# Patient Record
Sex: Male | Born: 1970 | Race: White | Hispanic: No | Marital: Married | Smoking: Never smoker
Health system: Southern US, Community
[De-identification: ages and names within clinical notes are randomized; demographics above are authoritative.]

## PROBLEM LIST (undated history)

## (undated) HISTORY — PX: TONSILLECTOMY: SUR1361

---

## 2005-12-31 ENCOUNTER — Emergency Department: Payer: Self-pay | Admitting: Emergency Medicine

## 2009-08-27 ENCOUNTER — Ambulatory Visit: Payer: Self-pay | Admitting: Family Medicine

## 2009-08-28 ENCOUNTER — Ambulatory Visit: Payer: Self-pay | Admitting: Oncology

## 2009-09-04 ENCOUNTER — Ambulatory Visit: Payer: Self-pay | Admitting: Oncology

## 2009-09-15 ENCOUNTER — Ambulatory Visit: Payer: Self-pay | Admitting: General Surgery

## 2009-09-16 ENCOUNTER — Ambulatory Visit: Payer: Self-pay | Admitting: General Surgery

## 2009-09-22 ENCOUNTER — Ambulatory Visit: Payer: Self-pay | Admitting: Oncology

## 2009-09-25 ENCOUNTER — Ambulatory Visit: Payer: Self-pay | Admitting: Oncology

## 2009-10-01 ENCOUNTER — Ambulatory Visit: Payer: Self-pay | Admitting: Vascular Surgery

## 2009-10-20 ENCOUNTER — Ambulatory Visit: Payer: Self-pay | Admitting: Oncology

## 2009-11-20 ENCOUNTER — Ambulatory Visit: Payer: Self-pay | Admitting: Oncology

## 2009-12-20 ENCOUNTER — Ambulatory Visit: Payer: Self-pay | Admitting: Oncology

## 2010-01-20 ENCOUNTER — Ambulatory Visit: Payer: Self-pay | Admitting: Oncology

## 2010-02-19 ENCOUNTER — Ambulatory Visit: Payer: Self-pay | Admitting: Oncology

## 2010-03-22 ENCOUNTER — Ambulatory Visit: Payer: Self-pay | Admitting: Internal Medicine

## 2010-03-22 ENCOUNTER — Ambulatory Visit: Payer: Self-pay | Admitting: Oncology

## 2010-04-22 ENCOUNTER — Ambulatory Visit: Payer: Self-pay | Admitting: Internal Medicine

## 2010-04-22 ENCOUNTER — Ambulatory Visit: Payer: Self-pay | Admitting: Oncology

## 2010-04-27 ENCOUNTER — Ambulatory Visit: Payer: Self-pay | Admitting: Oncology

## 2010-05-06 ENCOUNTER — Ambulatory Visit: Payer: Self-pay | Admitting: Vascular Surgery

## 2010-05-22 ENCOUNTER — Ambulatory Visit: Payer: Self-pay | Admitting: Internal Medicine

## 2010-05-22 ENCOUNTER — Ambulatory Visit: Payer: Self-pay | Admitting: Oncology

## 2010-06-22 ENCOUNTER — Ambulatory Visit: Payer: Self-pay | Admitting: Internal Medicine

## 2010-06-22 ENCOUNTER — Ambulatory Visit: Payer: Self-pay | Admitting: Oncology

## 2010-07-22 ENCOUNTER — Ambulatory Visit: Payer: Self-pay | Admitting: Oncology

## 2010-07-27 ENCOUNTER — Ambulatory Visit: Payer: Self-pay | Admitting: Oncology

## 2010-08-22 ENCOUNTER — Ambulatory Visit: Payer: Self-pay

## 2010-08-22 ENCOUNTER — Ambulatory Visit: Payer: Self-pay | Admitting: Oncology

## 2010-11-12 ENCOUNTER — Ambulatory Visit: Payer: Self-pay | Admitting: Radiation Oncology

## 2010-11-18 ENCOUNTER — Ambulatory Visit: Payer: Self-pay | Admitting: Oncology

## 2010-11-21 ENCOUNTER — Ambulatory Visit: Payer: Self-pay | Admitting: Radiation Oncology

## 2010-12-15 ENCOUNTER — Ambulatory Visit: Payer: Self-pay | Admitting: Oncology

## 2010-12-21 ENCOUNTER — Ambulatory Visit: Payer: Self-pay | Admitting: Oncology

## 2011-02-20 IMAGING — NM NM  CARDIAC MUGA REST SCAN 2 0F 2
4 series · 24 of 24 positions shown · non-contrast
Comparison: none

REASON FOR EXAM: hodgkins lymphoma high risk meds  prechemo
COMMENTS:

[Series 1000: lao 45-gated (results) · 6.59mm/px · 6 of 24 frames shown]
[frame 3/24]
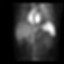
[frame 7/24]
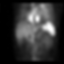
[frame 11/24]
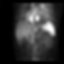
[frame 15/24]
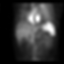
[frame 19/24]
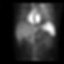
[frame 23/24]
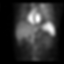

[Series 1000: lao 45-gated · 6.59mm/px · 6 of 24 frames shown]
[frame 3/24]
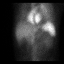
[frame 7/24]
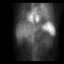
[frame 11/24]
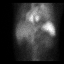
[frame 15/24]
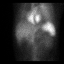
[frame 19/24]
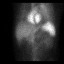
[frame 23/24]
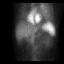

[Series 1000: ant-gated · 6.59mm/px · 6 of 24 frames shown]
[frame 3/24]
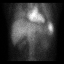
[frame 7/24]
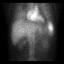
[frame 11/24]
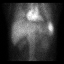
[frame 15/24]
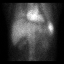
[frame 19/24]
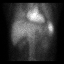
[frame 23/24]
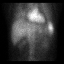

[Series 1000: lao 70-gated · 6.59mm/px · 6 of 24 frames shown]
[frame 3/24]
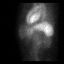
[frame 7/24]
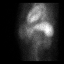
[frame 11/24]
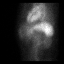
[frame 15/24]
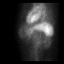
[frame 19/24]
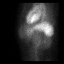
[frame 23/24]
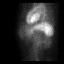

[24 of 24 positions shown; findings below may reference images not displayed]

PROCEDURE:     NM  - NM REST MUGA SCAN [DATE] OF [DATE]  - [DATE]  [DATE] [DATE]  [DATE]

RESULT:     Following intravenous administration of 3 mL PYP and 23.8 mCi
technetium 99m pertechnetate, rest MUGA scan was performed in 8 views.
Review of the left ventricular cine loop shows no akinetic or dyskinetic
myocardial segments.

The left ventricular ejection fraction measures 56.5% which is in the normal
range.
IMPRESSION: Normal study. The left ventricular ejection fraction measures 56.5%.

## 2011-02-21 IMAGING — XA IR VASCULAR PROCEDURE
1 series · 1 of 1 positions shown · non-contrast
Comparison: none

[Series 1: single · 1 of 1 slices shown]
[im 1/1]
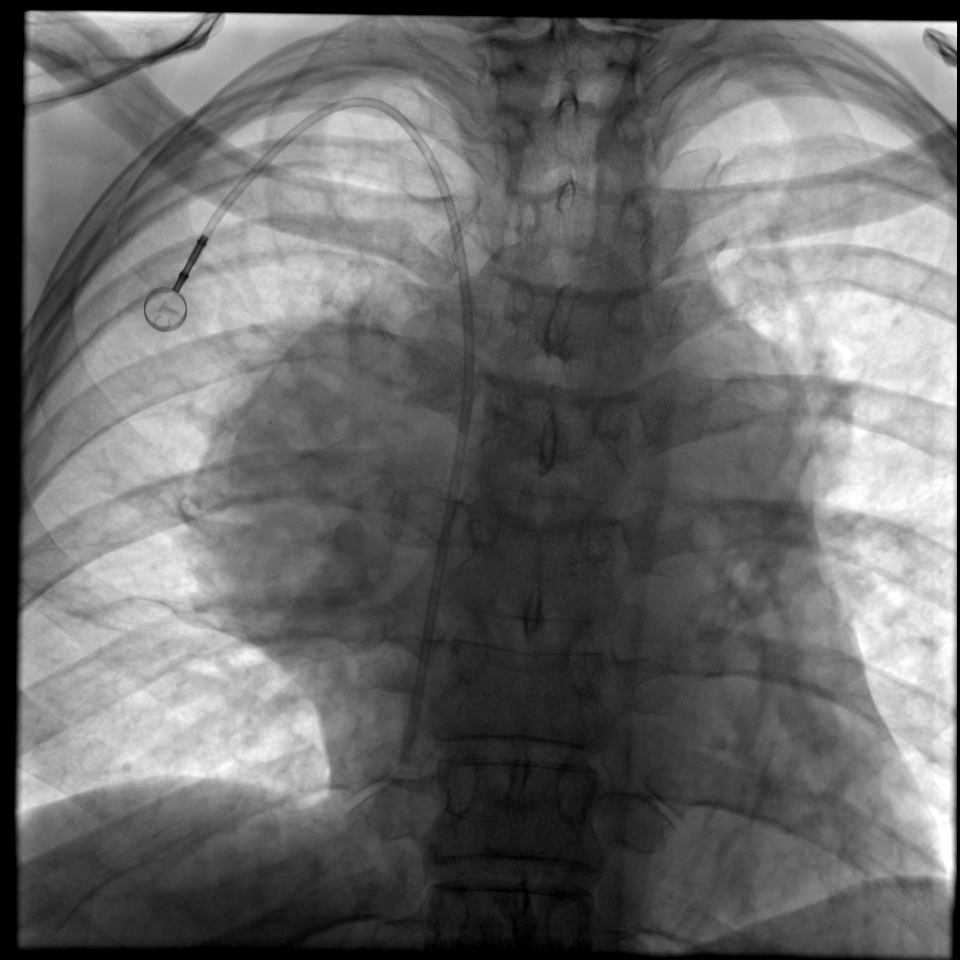

[1 of 1 positions shown; findings below may reference images not displayed]

IMAGES IMPORTED FROM THE [employer] WORKFLOW SYSTEM
NO DICTATION FOR STUDY

## 2011-05-16 ENCOUNTER — Ambulatory Visit: Payer: Self-pay | Admitting: Oncology

## 2011-05-23 ENCOUNTER — Ambulatory Visit: Payer: Self-pay | Admitting: Oncology

## 2011-08-05 ENCOUNTER — Ambulatory Visit: Payer: Self-pay | Admitting: Radiation Oncology

## 2011-08-30 ENCOUNTER — Ambulatory Visit: Payer: Self-pay | Admitting: Oncology

## 2012-08-24 ENCOUNTER — Ambulatory Visit: Payer: Self-pay | Admitting: Radiation Oncology

## 2013-02-22 ENCOUNTER — Emergency Department: Payer: Self-pay | Admitting: Emergency Medicine

## 2013-08-14 ENCOUNTER — Ambulatory Visit: Payer: Self-pay | Admitting: Radiation Oncology

## 2013-08-16 ENCOUNTER — Ambulatory Visit: Payer: Self-pay | Admitting: Radiation Oncology

## 2013-08-22 ENCOUNTER — Ambulatory Visit: Payer: Self-pay | Admitting: Radiation Oncology

## 2014-08-20 ENCOUNTER — Ambulatory Visit: Payer: Self-pay | Admitting: Radiation Oncology

## 2015-12-05 ENCOUNTER — Encounter: Payer: Self-pay | Admitting: Emergency Medicine

## 2015-12-05 ENCOUNTER — Ambulatory Visit
Admission: EM | Admit: 2015-12-05 | Discharge: 2015-12-05 | Disposition: A | Discharge status: Home / Self Care | Attending: Family Medicine | Admitting: Family Medicine

## 2015-12-05 DIAGNOSIS — J101 Influenza due to other identified influenza virus with other respiratory manifestations: Secondary | ICD-10-CM

## 2015-12-05 LAB — RAPID INFLUENZA A&B ANTIGENS (ARMC ONLY): INFLUENZA A (ARMC): NEGATIVE

## 2015-12-05 LAB — RAPID INFLUENZA A&B ANTIGENS: Influenza B (ARMC): POSITIVE — AB

## 2015-12-05 MED ORDER — OSELTAMIVIR PHOSPHATE 75 MG PO CAPS: 75.0000 mg | ORAL_CAPSULE | Freq: Two times a day (BID) | ORAL | Status: AC

## 2015-12-05 NOTE — ED Provider Notes (Signed)
CSN: [id number]     Arrival date & time 12/05/15  1552 History   First MD Initiated Contact with Patient 12/05/15 1631     Chief Complaint  Patient presents with  . Influenza   (Consider location/radiation/quality/duration/timing/severity/associated sxs/prior Treatment) HPI: Patient presents today with symptoms of cough and fever. Patient states that he has had the symptoms for several days. He is unsure as to when the fever actually started. He does have myalgias. He denies any chest pain or shortness of breath. He does state that his wife also has similar symptoms.  History reviewed. No pertinent past medical history. Past Surgical History  Procedure Laterality Date  . Tonsillectomy     History reviewed. No pertinent family history. Social History  Substance Use Topics  . Smoking status: Never Smoker   . Smokeless tobacco: None  . Alcohol Use: Yes    Review of Systems: Negative except mentioned above.  Allergies  Review of patient's allergies indicates not on file.  Home Medications   Prior to Admission medications   Medication Sig Start Date End Date Taking? Authorizing Provider  oseltamivir (TAMIFLU) 75 MG capsule Take 1 capsule (75 mg total) by mouth every 12 (twelve) hours. 12/05/15   Jolene ProvostKirtida Cadi Rhinehart, MD   Meds Ordered and Administered this Visit  Medications - No data to display  BP 148/85 mmHg  Pulse 102  Temp(Src) 98.9 F (37.2 C) (Tympanic)  Resp 20  Ht 6' (1.829 m)  Wt 203 lb (92.08 kg)  BMI 27.53 kg/m2  SpO2 97% No data found.   Physical Exam   GENERAL: NAD HEENT: minimal pharyngeal erythema, no exudate, no erythema of TMs, no cervical LAD RESP: CTA B CARD: RRR NEURO: CN II-XII grossly intact   ED Course  Procedures (including critical care time)  Labs Review Labs Reviewed  RAPID INFLUENZA A&B ANTIGENS ([facility] ONLY) - Abnormal; Notable for the following:    Influenza B ([facility]) POSITIVE (*)    All other components within normal limits    Imaging  Review No results found.    MDM   1. Influenza B   Discuss risk/benefits of taking Tamiflu. Encourage patient to take cough suppressant, antihistamine, Tylenol/Motrin when necessary. Encourage rest and hydration. If his symptoms do persist or worsen I do recommend that he follow-up with his primary care physician or seek other medical attention.    Jolene ProvostKirtida Smt Lokey, MD 12/05/15 [phone]

## 2015-12-05 NOTE — ED Notes (Signed)
Patient states he developed flu like symptoms about 1 week ago.

## 2023-12-03 ENCOUNTER — Emergency Department
Admission: EM | Admit: 2023-12-03 | Discharge: 2023-12-03 | Disposition: A | Discharge status: Home / Self Care | Attending: Emergency Medicine | Admitting: Emergency Medicine

## 2023-12-03 ENCOUNTER — Other Ambulatory Visit: Payer: Self-pay

## 2023-12-03 DIAGNOSIS — R531 Weakness: Secondary | ICD-10-CM | POA: Diagnosis not present

## 2023-12-03 DIAGNOSIS — M791 Myalgia, unspecified site: Secondary | ICD-10-CM | POA: Insufficient documentation

## 2023-12-03 DIAGNOSIS — M7989 Other specified soft tissue disorders: Secondary | ICD-10-CM | POA: Diagnosis present

## 2023-12-03 DIAGNOSIS — L03116 Cellulitis of left lower limb: Secondary | ICD-10-CM | POA: Diagnosis not present

## 2023-12-03 DIAGNOSIS — R519 Headache, unspecified: Secondary | ICD-10-CM | POA: Insufficient documentation

## 2023-12-03 LAB — CBC
HCT: 45.8 % (ref 39.0–52.0)
Hemoglobin: 15.9 g/dL (ref 13.0–17.0)
MCH: 30.8 pg (ref 26.0–34.0)
MCHC: 34.7 g/dL (ref 30.0–36.0)
MCV: 88.6 fL (ref 80.0–100.0)
Platelets: 181 10*3/uL (ref 150–400)
RBC: 5.17 MIL/uL (ref 4.22–5.81)
RDW: 12.6 % (ref 11.5–15.5)
WBC: 5.7 10*3/uL (ref 4.0–10.5)
nRBC: 0 % (ref 0.0–0.2)

## 2023-12-03 LAB — BASIC METABOLIC PANEL WITH GFR
Anion gap: 6 (ref 5–15)
BUN: 16 mg/dL (ref 6–20)
CO2: 26 mmol/L (ref 22–32)
Calcium: 9.2 mg/dL (ref 8.9–10.3)
Chloride: 103 mmol/L (ref 98–111)
Creatinine, Ser: 1.43 mg/dL — ABNORMAL HIGH (ref 0.61–1.24)
GFR, Estimated: 58 mL/min — ABNORMAL LOW (ref >60)
Glucose, Bld: 103 mg/dL — ABNORMAL HIGH (ref 70–99)
Potassium: 4 mmol/L (ref 3.5–5.1)
Sodium: 135 mmol/L (ref 135–145)

## 2023-12-03 MED ORDER — SULFAMETHOXAZOLE-TRIMETHOPRIM 800-160 MG PO TABS: 1.0000 | ORAL_TABLET | Freq: Two times a day (BID) | ORAL | 0 refills | Status: AC

## 2023-12-03 NOTE — ED Triage Notes (Addendum)
 Pt comes with insect bite to left ankle about week ago. Pt states it has gotten worse with swelling, redness and more painful. Pt states fever last night.

## 2023-12-03 NOTE — ED Provider Notes (Signed)
 [facility] Provider Note    Event Date/Time   First MD Initiated Contact with Patient 12/03/23 1645     (approximate)   History   Insect Bite   HPI  Douglas Jacobson is a 53 y.o. male who presents to the emergency department today because of concerns for insect bite to his left ankle.  The patient states that he first noticed insect bite about 8 or 9 days ago.  He thinks it was secondary to a spider.  Since that time he is developed some headaches, chills, body aches and weakness.  He had some increased swelling to that ankle although at the time my exam says it has improved.     Physical Exam   Triage Vital Signs: ED Triage Vitals  Encounter Vitals Group     BP 12/03/23 1614 126/88     Systolic BP Percentile --      Diastolic BP Percentile --      Pulse Rate 12/03/23 1614 92     Resp 12/03/23 1614 18     Temp 12/03/23 1614 97.6 F (36.4 C)     Temp src --      SpO2 12/03/23 1614 100 %     Weight --      Height --      Head Circumference --      Peak Flow --      Pain Score 12/03/23 1613 2     Pain Loc --      Pain Education --      Exclude from Growth Chart --     Most recent vital signs: Vitals:   12/03/23 1614  BP: 126/88  Pulse: 92  Resp: 18  Temp: 97.6 F (36.4 C)  SpO2: 100%   General: Awake, alert, oriented. CV:  Good peripheral perfusion. Regular rate and rhythm. Resp:  Normal effort. Lungs clear. Abd:  No distention.  Ext:  Small lesion to left ankle with surrounding erythema, tender to palpation. No fluctuance.    ED Results / Procedures / Treatments   Labs (all labs ordered are listed, but only abnormal results are displayed) Labs Reviewed  BASIC METABOLIC PANEL WITH GFR - Abnormal; Notable for the following components:      Result Value   Glucose, Bld 103 (*)    Creatinine, Ser 1.43 (*)    GFR, Estimated 58 (*)    All other components within normal limits  CBC      EKG  None   RADIOLOGY None  PROCEDURES:  Critical Care performed: No    MEDICATIONS ORDERED IN ED: Medications - No data to display   IMPRESSION / MDM / ASSESSMENT AND PLAN / ED COURSE  I reviewed the triage vital signs and the nursing notes.                              Differential diagnosis includes, but is not limited to, cellulitis, abscess  Patient's presentation is most consistent with acute presentation with potential threat to life or bodily function.  Patient presented to the emergency department today because of concerns for insect bite to left ankle.  On exam he does have a small lesion there with some surrounding erythema consistent with cellulitis.  Blood work without concerning leukocytosis.  Will plan on discharging with prescription for antibiotics.      FINAL CLINICAL IMPRESSION(S) / ED DIAGNOSES   Final diagnoses:  Cellulitis of  left lower extremity      Note:  This document was prepared using Dragon voice recognition software and may include unintentional dictation errors.    Marylynn Soho, MD 12/03/23 [phone]

## 2023-12-03 NOTE — ED Notes (Signed)
 Pt seen by EDP and d/c'd prior to RN assessment, see MD notes, orders received to d/c. Labs, hx, and VS, reviewed.
